# Patient Record
Sex: Female | Born: 1975 | Race: Black or African American | Hispanic: No | Marital: Married
Health system: Southern US, Community
[De-identification: ages and names within clinical notes are randomized; demographics above are authoritative.]

## PROBLEM LIST (undated history)

## (undated) DIAGNOSIS — I10 Essential (primary) hypertension: Secondary | ICD-10-CM

## (undated) HISTORY — PX: ABDOMINAL HYSTERECTOMY: SHX81

---

## 2019-11-04 ENCOUNTER — Encounter: Payer: Self-pay | Admitting: Emergency Medicine

## 2019-11-04 ENCOUNTER — Emergency Department: Payer: 59

## 2019-11-04 ENCOUNTER — Emergency Department
Admission: EM | Admit: 2019-11-04 | Discharge: 2019-11-04 | Disposition: A | Discharge status: Home / Self Care | Payer: 59 | Attending: Student | Admitting: Student

## 2019-11-04 ENCOUNTER — Other Ambulatory Visit: Payer: Self-pay

## 2019-11-04 DIAGNOSIS — R0789 Other chest pain: Secondary | ICD-10-CM | POA: Diagnosis not present

## 2019-11-04 DIAGNOSIS — R519 Headache, unspecified: Secondary | ICD-10-CM | POA: Insufficient documentation

## 2019-11-04 DIAGNOSIS — R079 Chest pain, unspecified: Secondary | ICD-10-CM | POA: Diagnosis present

## 2019-11-04 DIAGNOSIS — I10 Essential (primary) hypertension: Secondary | ICD-10-CM | POA: Insufficient documentation

## 2019-11-04 HISTORY — DX: Essential (primary) hypertension: I10

## 2019-11-04 LAB — CBC
HCT: 37.5 % (ref 36.0–46.0)
Hemoglobin: 12.8 g/dL (ref 12.0–15.0)
MCH: 27.7 pg (ref 26.0–34.0)
MCHC: 34.1 g/dL (ref 30.0–36.0)
MCV: 81.2 fL (ref 80.0–100.0)
Platelets: 296 10*3/uL (ref 150–400)
RBC: 4.62 MIL/uL (ref 3.87–5.11)
RDW: 14 % (ref 11.5–15.5)
WBC: 7 10*3/uL (ref 4.0–10.5)
nRBC: 0 % (ref 0.0–0.2)

## 2019-11-04 LAB — COMPREHENSIVE METABOLIC PANEL
ALT: 15 U/L (ref 0–44)
AST: 17 U/L (ref 15–41)
Albumin: 4 g/dL (ref 3.5–5.0)
Alkaline Phosphatase: 48 U/L (ref 38–126)
Anion gap: 8 (ref 5–15)
BUN: 10 mg/dL (ref 6–20)
CO2: 27 mmol/L (ref 22–32)
Calcium: 9.2 mg/dL (ref 8.9–10.3)
Chloride: 102 mmol/L (ref 98–111)
Creatinine, Ser: 0.86 mg/dL (ref 0.44–1.00)
GFR calc Af Amer: >60 mL/min (ref >60)
GFR calc non Af Amer: >60 mL/min (ref >60)
Glucose, Bld: 115 mg/dL — ABNORMAL HIGH (ref 70–99)
Potassium: 3.3 mmol/L — ABNORMAL LOW (ref 3.5–5.1)
Sodium: 137 mmol/L (ref 135–145)
Total Bilirubin: 0.7 mg/dL (ref 0.3–1.2)
Total Protein: 7.9 g/dL (ref 6.5–8.1)

## 2019-11-04 LAB — TROPONIN I (HIGH SENSITIVITY)
Troponin I (High Sensitivity): 4 ng/L (ref <18)
Troponin I (High Sensitivity): 5 ng/L (ref <18)

## 2019-11-04 IMAGING — CT CT HEAD W/O CM
3 series · 16 of 47 positions shown, 19 images · non-contrast
Comparison: None.

CLINICAL DATA: 43-year-old female with severe acute headache. Worst
headache of life.

EXAM:
CT HEAD WITHOUT CONTRAST
TECHNIQUE: Contiguous axial images were obtained from the base of the skull
through the vertex without intravenous contrast.

[Series 2: head wo · axial · 0.41mm/px · z∈[+487,+612]mm · 10 of 31 slices shown, 13 images]
[im 3/31  brain]
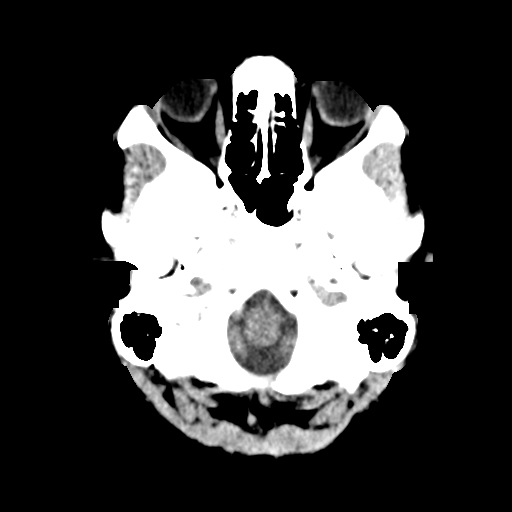
[im 3/31  bone]
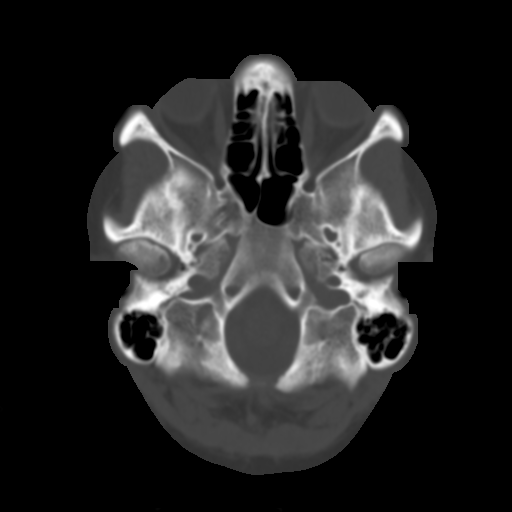
[im 6/31  brain]
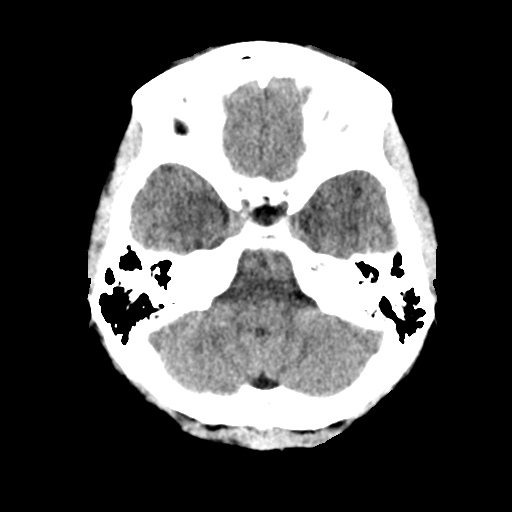
[im 9/31  brain]
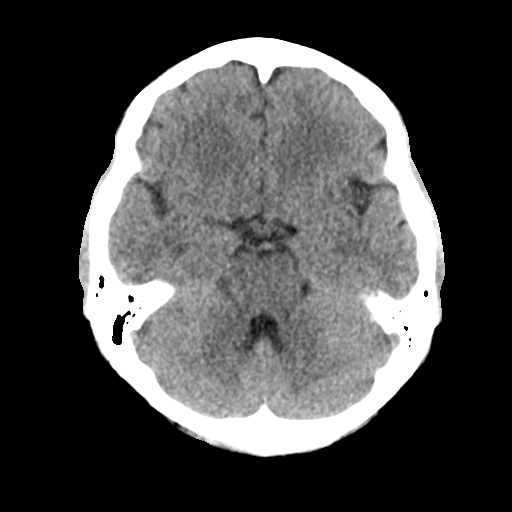
[im 11/31  brain]
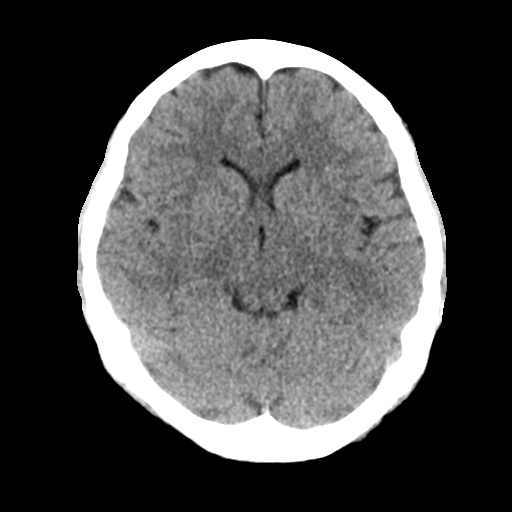
[im 14/31  brain]
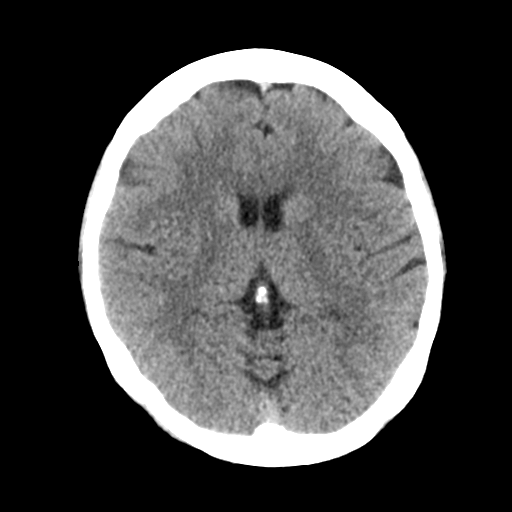
[im 14/31  bone]
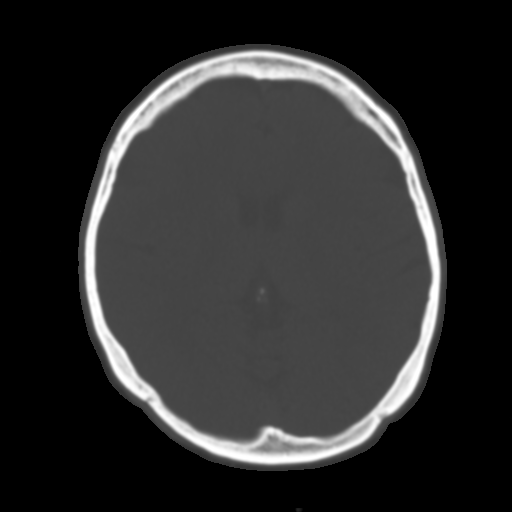
[im 17/31  brain]
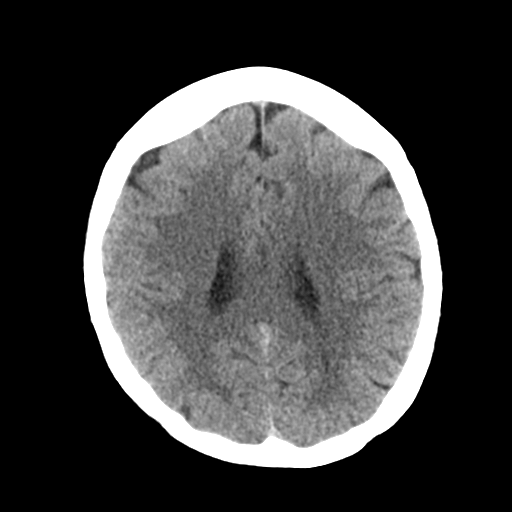
[im 20/31  brain]
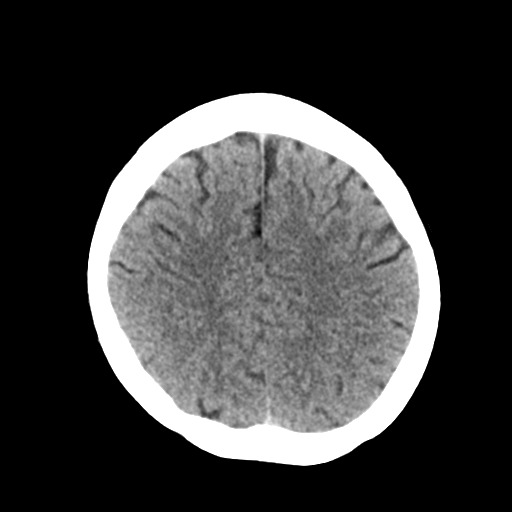
[im 23/31  brain]
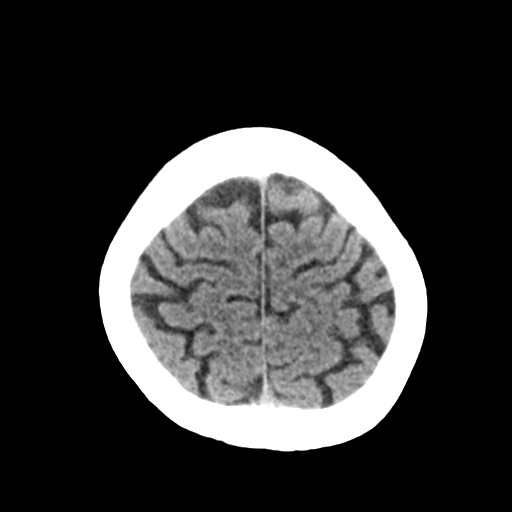
[im 25/31  brain]
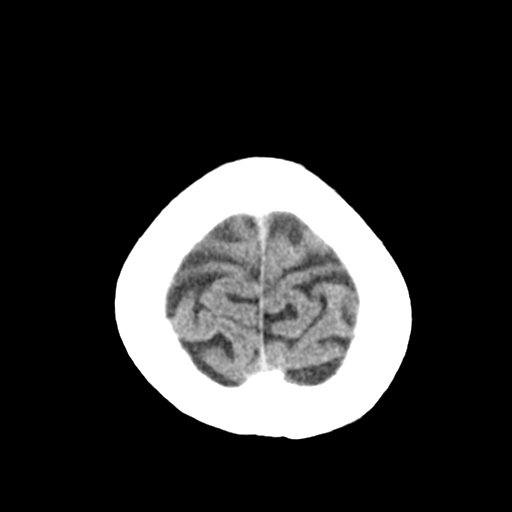
[im 25/31  bone]
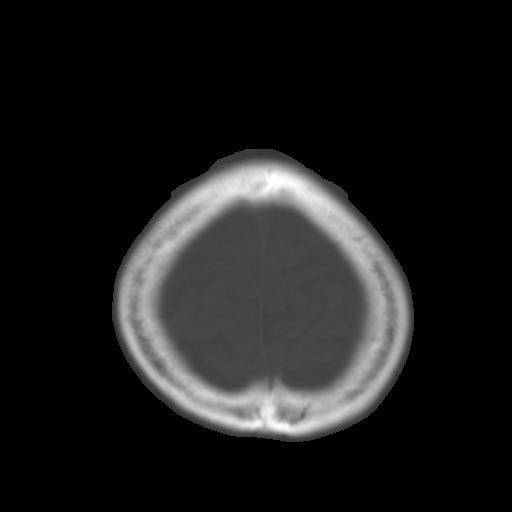
[im 28/31  brain]
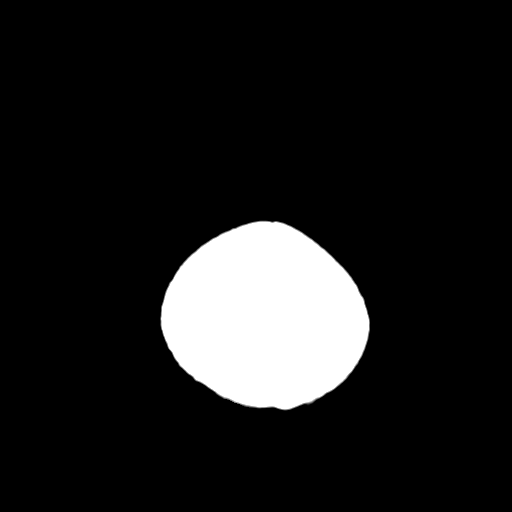

[Series 4: coronal soft tissue · coronal · 0.31mm/px · 3 of 61 slices shown]
[im 21/61  brain]
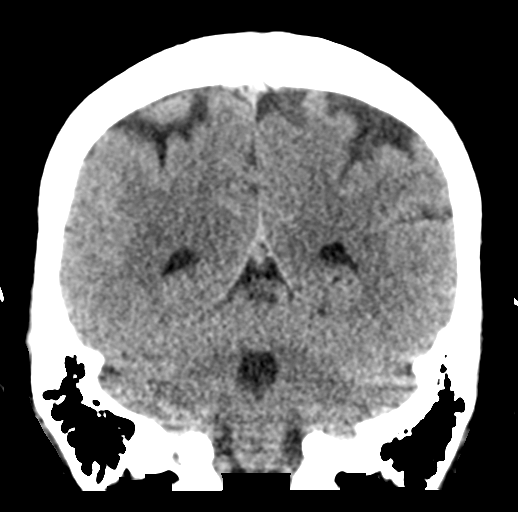
[im 27/61  brain]
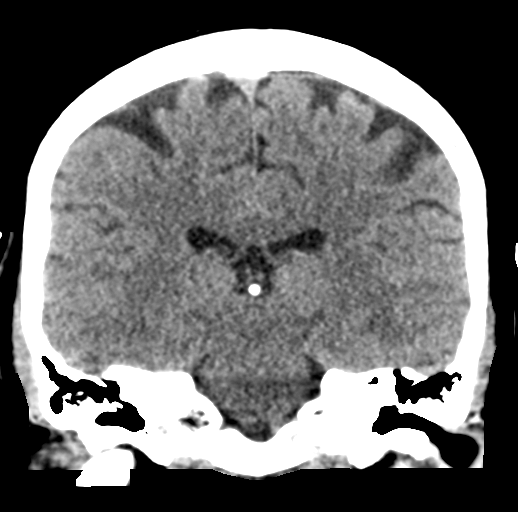
[im 34/61  brain]
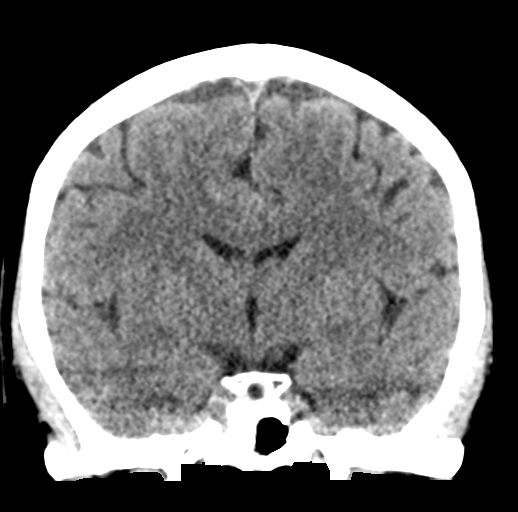

[Series 5: sagittal soft tissue · sagittal · 0.32mm/px · 3 of 55 slices shown]
[im 19/55  brain]
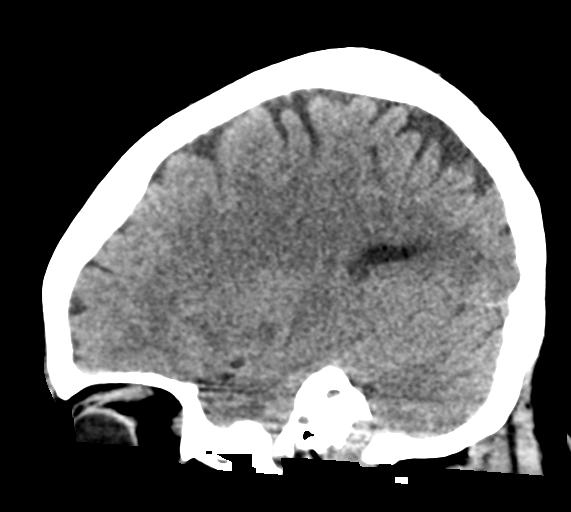
[im 28/55  brain]
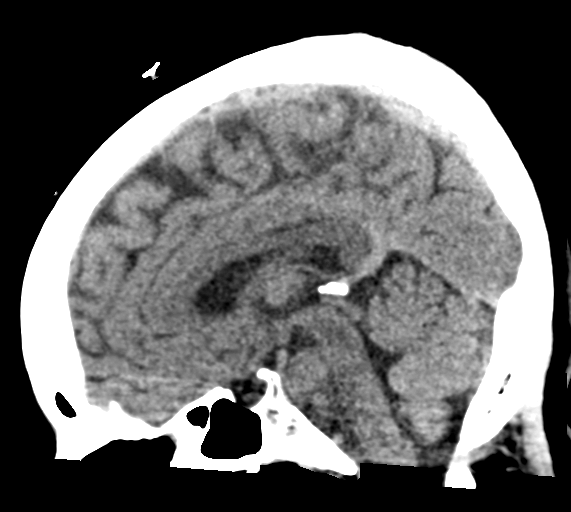
[im 37/55  brain]
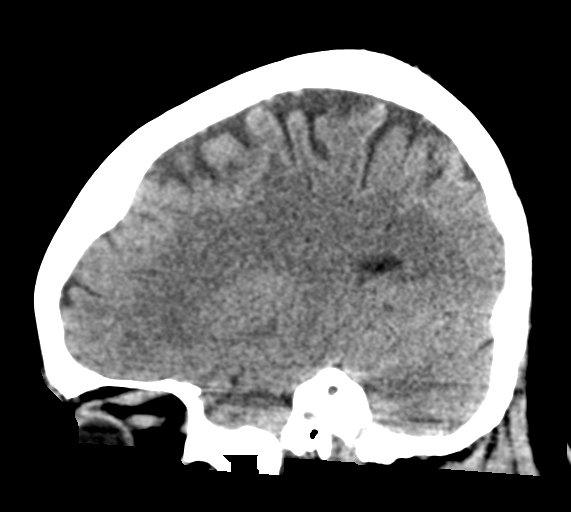

[16 of 47 positions shown; findings below may reference images not displayed]

FINDINGS: Brain: Normal cerebral volume. No midline shift, ventriculomegaly,
mass effect, evidence of mass lesion, intracranial hemorrhage or
evidence of cortically based acute infarction. Gray-white matter
differentiation is within normal limits throughout the brain.

Vascular: No suspicious intracranial vascular hyperdensity.

Skull: Negative.

Sinuses/Orbits: Visualized paranasal sinuses and mastoids are clear.

Other: Visualized orbits and scalp soft tissues are within normal
limits.
IMPRESSION: Normal noncontrast CT appearance of the brain.

## 2019-11-04 IMAGING — DX DG CHEST 1V PORT
1 series · 1 of 1 positions shown · non-contrast
Comparison: None.

CLINICAL DATA: Chest pain, headache.

EXAM:
PORTABLE CHEST 1 VIEW

[chest ap]
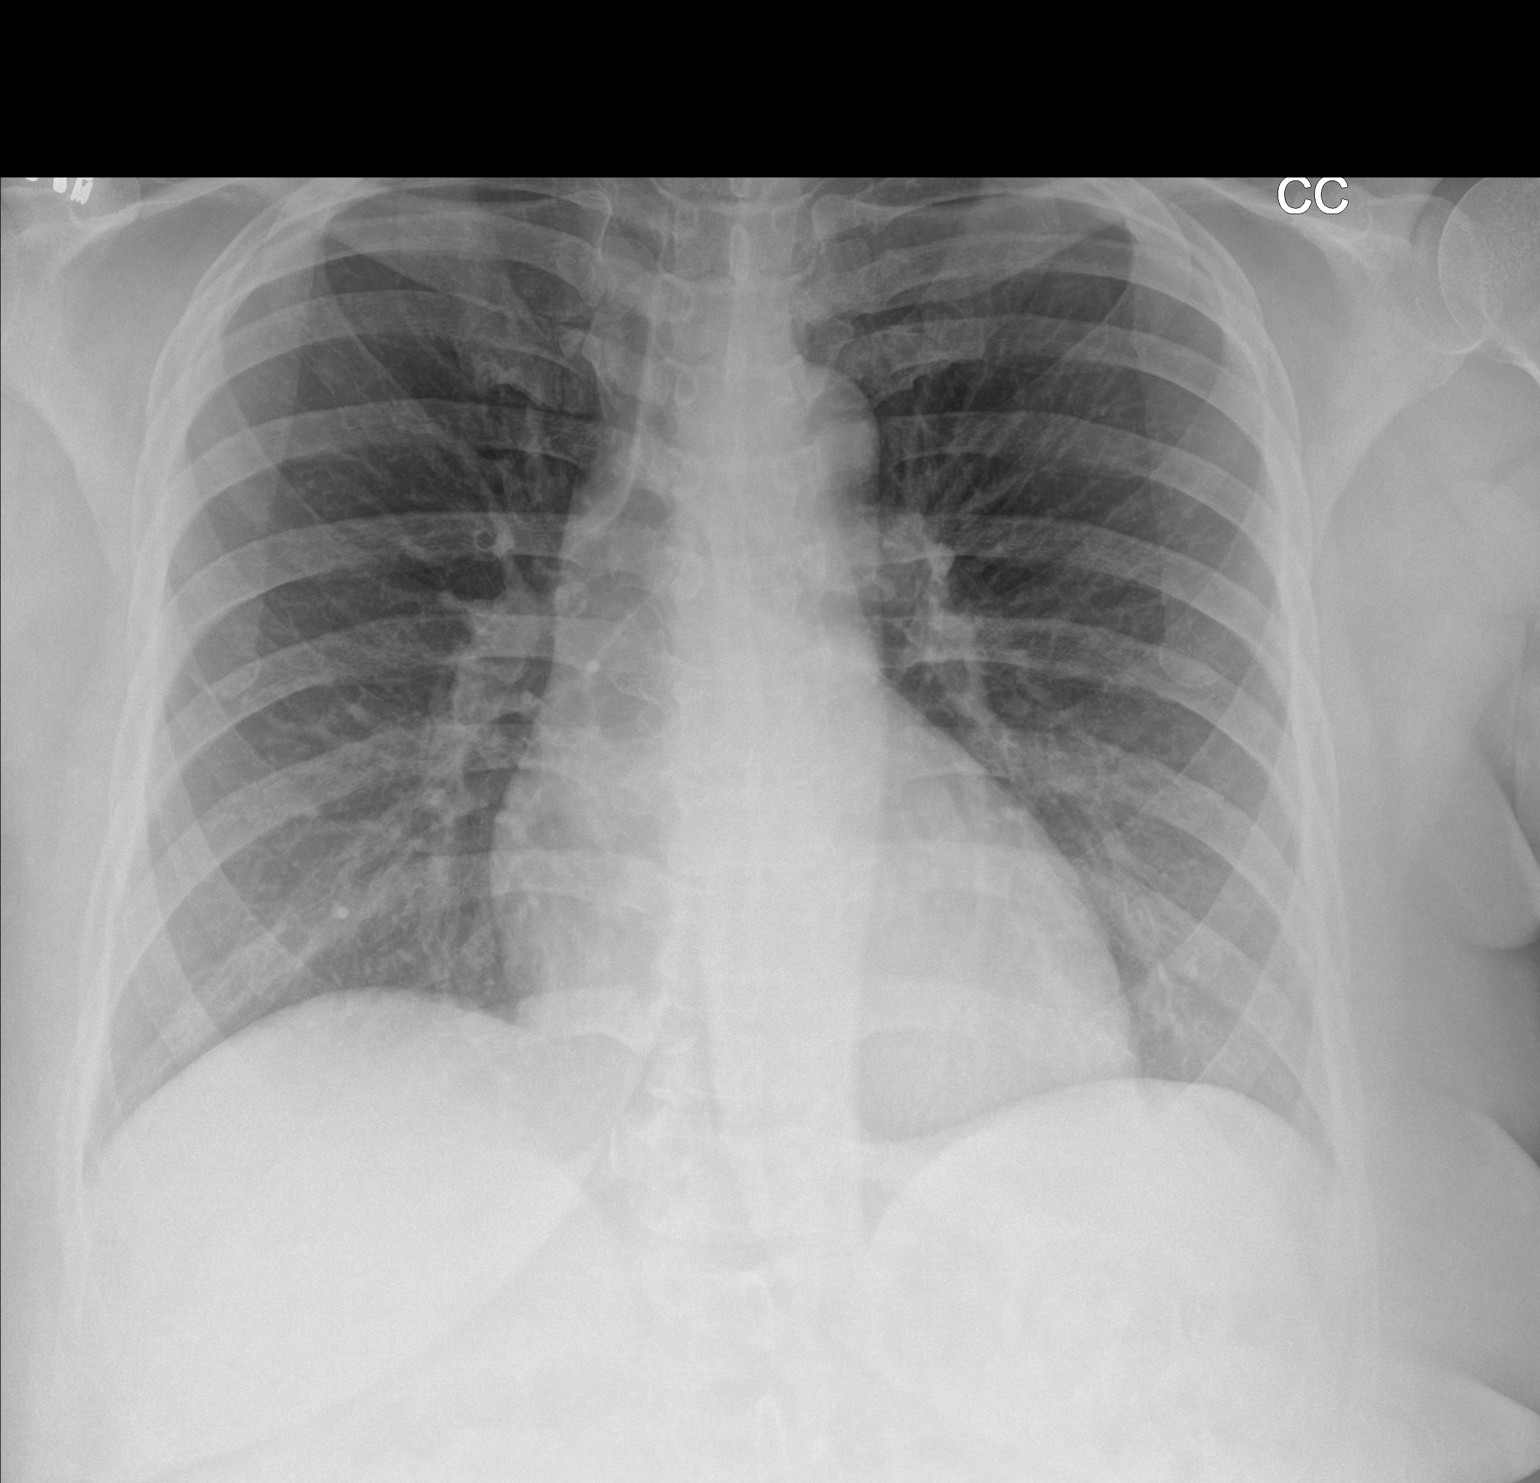

[1 of 1 positions shown; findings below may reference images not displayed]

FINDINGS: The heart size and mediastinal contours are within normal limits.
Both lungs are clear. The visualized skeletal structures are
unremarkable.
IMPRESSION: No acute cardiopulmonary disease.

## 2019-11-04 MED ORDER — ALUM & MAG HYDROXIDE-SIMETH 200-200-20 MG/5ML PO SUSP
30.0000 mL | Freq: Once | ORAL | Status: AC
  Administered 2019-11-04: 30 mL via ORAL
  Filled 2019-11-04: qty 30

## 2019-11-04 MED ORDER — SODIUM CHLORIDE 0.9 % IV BOLUS: 1000.0000 mL | Freq: Once | INTRAVENOUS | Status: DC

## 2019-11-04 MED ORDER — SODIUM CHLORIDE 0.9 % IV BOLUS
500.0000 mL | Freq: Once | INTRAVENOUS | Status: AC
  Administered 2019-11-04: 500 mL via INTRAVENOUS

## 2019-11-04 MED ORDER — METOCLOPRAMIDE HCL 5 MG/ML IJ SOLN
10.0000 mg | Freq: Once | INTRAMUSCULAR | Status: AC
  Administered 2019-11-04: 10 mg via INTRAVENOUS
  Filled 2019-11-04: qty 2

## 2019-11-04 MED ORDER — ACETAMINOPHEN 500 MG PO TABS
1000.0000 mg | ORAL_TABLET | Freq: Once | ORAL | Status: AC
  Administered 2019-11-04: 1000 mg via ORAL
  Filled 2019-11-04: qty 2

## 2019-11-04 MED ORDER — DIPHENHYDRAMINE HCL 50 MG/ML IJ SOLN
12.5000 mg | Freq: Once | INTRAMUSCULAR | Status: AC
  Administered 2019-11-04: 15:00:00 12.5 mg via INTRAVENOUS
  Filled 2019-11-04: qty 1

## 2019-11-04 NOTE — ED Provider Notes (Signed)
[facility] Emergency Department Provider Note  ____________________________________________   None    (approximate)   I have reviewed the triage vital signs and the nursing notes.   Patient has been triaged with a MSE exam performed by myself at a minimum. Based on symptoms and screening exam, patient may receive a more in-depth exam, labs, imaging as detailed below. Patients have been advised of this setting and exam type at the time of patient interview.    HISTORY  Chief Complaint Chest Pain and Headache    HPI Dana Franklin is a 43 y.o. female presents to the emergency department with a complaint of with complaints of chest pain and headache.  HA is worst she has ever had.  Feels like pressure.  Cp radiates to left arm.  No sob or diaphoresis.  Is seeing floaters.  Did have a episode of seeing a dark area like a shade in left eye but it went away.  No slurred speech    Patient will receive a medical screening exam as detailed below.  Based off of this exam, more in depth exam, labs, imaging will be performed as needed for complaint.  Patient care will be eventually transferred to another provider in the emergency department for final exam, diagnosis and disposition.    No past medical history on file.  There are no active problems to display for this patient.    Prior to Admission medications   Not on File    Allergies Patient has no allergy information on record.  No family history on file.  Social History Social History   Tobacco Use  . Smoking status: Not on file  Substance Use Topics  . Alcohol use: Not on file  . Drug use: Not on file    Review of Systems Constitutional: denies fever ENT: denies nasal congestion/rhinorhea. no sore throat Cardiovascular: c/o chest pain.with radiation to left arm  Respiratory: denies cough. denies shortness of breath/difficulty breathing Gastroenterology: denies abdominal pain  Musculoskeletal: deneis for musculoskeletal pain Integumentary: Negative for rash. Neurological: No focal weakness nor numbness. Headache, numbness in left arm, visual change that has resolved   ____________________________________________   PHYSICAL EXAM:  VITAL SIGNS: ED Triage Vitals  Enc Vitals Group     BP      Pulse      Resp      Temp      Temp src      SpO2      Weight      Height      Head Circumference      Peak Flow      Pain Score      Pain Loc      Pain Edu?      Excl. in [place]?     Constitutional: Alert and oriented. Generally well appearing and in no acute distress. Eyes: Conjunctivae are normal.  Nose: No significant congestion/rhinnorhea. Mouth: No gross oropharyngeal edema. no erythema/edema Neck: No stridor.  No meningeal signs.   Cardiovascular: Grossly normal heart sounds. Respiratory: Normal respiratory effort without significant tachypnea and no observed retractions. Lungs c t a Musculoskeletal: No gross deformities of extremities. Neurologic:  Normal speech and language. No gross focal neurologic deficits are appreciated. Grips = b/l,  Skin:  Skin is warm, dry and intact. No rash noted.    ____________________________________________   LABS (all labs ordered are listed, but only abnormal results are displayed)  Labs Reviewed - No data to display  ____________________________________________  RADIOLOGY Ct head cxr  Official radiology report(s): No results found.  ____________________________________________    INITIAL IMPRESSION / MDM / ASSESSMENT AND PLAN / ED COURSE  As part of my medical decision making, I reviewed the following data within the electronic MEDICAL RECORD NUMBER Nursing notes reviewed and incorporated, EKG interpreted NSR, Radiograph reviewed cxr and head ct, Notes from prior ED visits and [place] Controlled Substance Database      Clinical Impression: headache, chest pain   Plan: labs, cxr, ct head  Patient has  been screened based based on their arrival complaint, evaluated for an emergent condition, and at a minimum has received a medical screening exam.  At this time, patient will receive further work-up as determined by medical screening exam.  Patient care will eventually be transferred to another provider in the emergency department for final diagnosis and disposition.    ____________________________________________  Note:  This document was prepared using Conservation officer, historic buildings and may include unintentional dictation errors.    Faythe Ghee, PA-C 11/04/19 1249    Emily Filbert, MD 11/04/19 [phone]

## 2019-11-04 NOTE — ED Triage Notes (Signed)
Chest pain since 8 am, took 4 baby ASA, began headache 1 hour ago. States L hand numbness began during this time.

## 2019-11-04 NOTE — Discharge Instructions (Addendum)
Thank you for letting us take care of you in the emergency department today.  ° °Please continue to take any regular, prescribed medications.  ° °Please follow up with: °- Your primary care doctor to review your ER visit and follow up on your symptoms.  ° °Please return to the ER for any new or worsening symptoms.  ° °

## 2019-11-04 NOTE — ED Provider Notes (Signed)
[facility] Emergency Department Provider Note  ____________________________________________   First MD Initiated Contact with Patient 11/04/19 1437     (approximate)  I have reviewed the triage vital signs and the nursing notes.  History  Chief Complaint Chest Pain and Headache    HPI Dana Franklin is a 43 y.o. female with hx of GERD, HTN who presents for chest pressure and headache.   She reports developing chest pressure this AM, which she felt was related to her GERD. She took her prescribed PPI and Tums, without significant relief. No associated SOB, or diaphoresis. Chest pressure located in the center. This does not radiate. Moderate in severity. Took 4 ASA prior to arrival. Hx of HTN. No hx of HLD, DM. Does not smoke.   A few hours later she developed a headache to the back of the head (she points to the parietoccipital area). She describes this as a pressure sensation. currently moderate in severity. Does not radiate. Accompanied by some left sided peripheral flashing lights right at the onset of the headache. No new weakness, numbness, tingling. No blurred vision.   She primarily present due to the headache.    Past Medical Hx Past Medical History:  Diagnosis Date  . Hypertension     Problem List There are no active problems to display for this patient.   Past Surgical Hx Past Surgical History:  Procedure Laterality Date  . ABDOMINAL HYSTERECTOMY      Medications Prior to Admission medications   Not on File    Allergies Hydrochlorothiazide  Family Hx No family history on file.  Social Hx Social History   Tobacco Use  . Smoking status: Not on file  Substance Use Topics  . Alcohol use: Not on file  . Drug use: Not on file     Review of Systems  Constitutional: Negative for fever, chills. Eyes: Negative for visual changes. ENT: Negative for sore throat. Cardiovascular:  + for chest pain. Respiratory: Negative for  shortness of breath. Gastrointestinal: Negative for nausea, vomiting.  Genitourinary: Negative for dysuria. Musculoskeletal: Negative for leg swelling. Skin: Negative for rash. Neurological: + for for headaches.   Physical Exam  Vital Signs: ED Triage Vitals  Enc Vitals Group     BP 11/04/19 1236 (!) 163/107     Pulse Rate 11/04/19 1236 72     Resp 11/04/19 1236 20     Temp 11/04/19 1236 (!) 97.5 F (36.4 C)     Temp Source 11/04/19 1236 Oral     SpO2 11/04/19 1236 100 %     Weight 11/04/19 1238 200 lb (90.7 kg)     Height 11/04/19 1238 5\' 7"  (1.702 m)     Head Circumference --      Peak Flow --      Pain Score 11/04/19 1238 10     Pain Loc --      Pain Edu? --      Excl. in [place]? --     Constitutional: Alert and oriented.  Head: Normocephalic. Atraumatic. Eyes: Conjunctivae clear. Sclera anicteric. EOMI. Visual fields in tact. Nose: No congestion. No rhinorrhea. Mouth/Throat: Mucous membranes are moist.  Neck: No stridor.   Cardiovascular: Normal rate, regular rhythm. Extremities well perfused. Respiratory: Normal respiratory effort.  Lungs CTAB. Gastrointestinal: Soft. Non-tender. Non-distended.  Musculoskeletal: No lower extremity edema. No deformities. Neurologic:  Normal speech and language. No gross focal neurologic deficits are appreciated. Alert and oriented.  Face symmetric.  Tongue midline.  Cranial nerves  II through XII intact. UE and LE strength 5/5 and symmetric. UE and LE SILT.  Skin: Skin is warm, dry and intact. No rash noted. Psychiatric: Mood and affect are appropriate for situation.  EKG  Personally reviewed.   Rate: 71 Rhythm: sinus Axis: borderline leftward Intervals: WNL Non-specific T wave flattening No acute ischemic changes No STEMI    Radiology  CT:  IMPRESSION:  Normal noncontrast CT appearance of the brain.   XR: IMPRESSION: No acute cardiopulmonary disease.   Procedures  Procedure(s) performed (including critical care):   Procedures   Initial Impression / Assessment and Plan / ED Course  43 y.o. female who presents to the ED for headache and chest pressure, as above.   Chest pressure, Ddx: ACS, GERD  Headache, DDx: suspect migraine w/ preceding aura, also consider complex migraine, tension headache.  Do not suspect infectious etiology given no neck stiffness, no fever.  Headache is not thunderclap in description or worst of life, low suspicion for [facility].  EKG without acute ischemic changes.  Troponin x 2 are negative.  Labs without actionable derangements.  CT head and CXR negative.  Patient reports resolution in her symptoms (both her chest discomfort and her headache) after GI cocktail and migraine cocktail, respectively.  As such, given her negative work-up she is stable for discharge with outpatient follow-up.  Patient is comfortable with the plan.  Given return precautions.   Final Clinical Impression(s) / ED Diagnosis  Final diagnoses:  Complaint of headache  Chest discomfort       Note:  This document was prepared using Dragon voice recognition software and may include unintentional dictation errors.   Miguel Aschoff., MD 11/04/19 [phone]

## 2022-05-18 ENCOUNTER — Other Ambulatory Visit: Payer: Self-pay | Admitting: Physical Medicine & Rehabilitation

## 2022-05-18 DIAGNOSIS — G8929 Other chronic pain: Secondary | ICD-10-CM

## 2022-05-27 ENCOUNTER — Ambulatory Visit
Admission: RE | Admit: 2022-05-27 | Discharge: 2022-05-27 | Disposition: A | Discharge status: Home / Self Care | Payer: 59 | Source: Ambulatory Visit | Attending: Physical Medicine & Rehabilitation | Admitting: Physical Medicine & Rehabilitation

## 2022-05-27 DIAGNOSIS — G8929 Other chronic pain: Secondary | ICD-10-CM

## 2022-05-27 DIAGNOSIS — M546 Pain in thoracic spine: Secondary | ICD-10-CM | POA: Insufficient documentation

## 2022-05-27 DIAGNOSIS — M5442 Lumbago with sciatica, left side: Secondary | ICD-10-CM | POA: Insufficient documentation

## 2022-05-27 IMAGING — MR MR THORACIC SPINE W/O CM
5 of 6 series · 28 of 48 positions shown · non-contrast
Comparison: None available.

CLINICAL DATA: Initial evaluation for upper back pain with
radiation into the left upper extremity with numbness and tingling
for 6 months.

EXAM:
MRI THORACIC SPINE WITHOUT CONTRAST
TECHNIQUE: Multiplanar, multisequence MR imaging of the thoracic spine was
performed. No intravenous contrast was administered.

[Series 18: T1 · sagittal · 6.0mm · 1.88mm/px · 2 of 9 slices shown (1 of 2)]
[im 1/9]
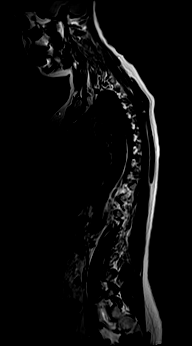
[im 9/9]
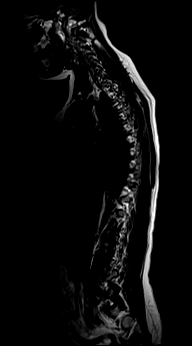

[Series 19: T2 · sagittal · 3.0mm · 1.06mm/px · 6 of 17 slices shown (1 of 2)]
[im 1/17]
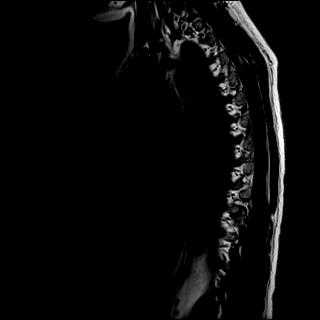
[im 4/17]
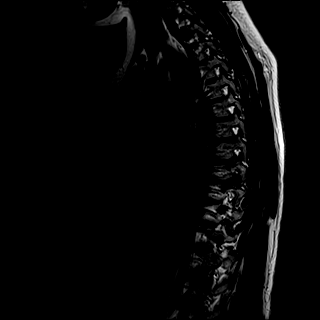
[im 7/17]
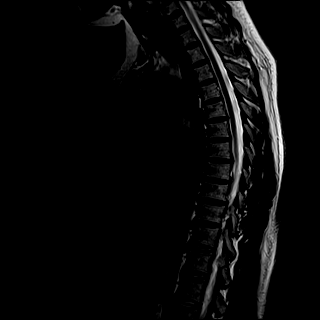
[im 10/17]
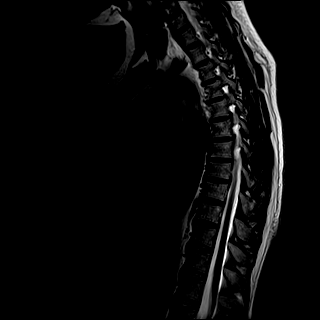
[im 13/17]
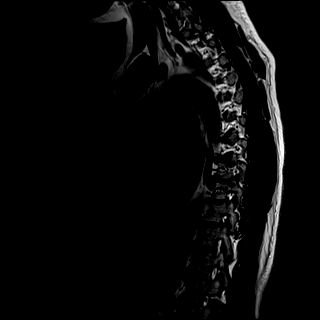
[im 17/17]
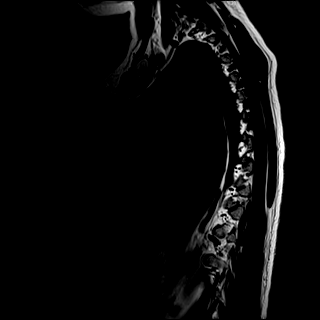

[Series 20: T1 · sagittal · 3.0mm · 1.06mm/px · 6 of 17 slices shown (2 of 2)]
[im 1/17]
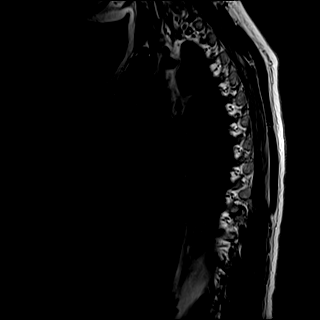
[im 4/17]
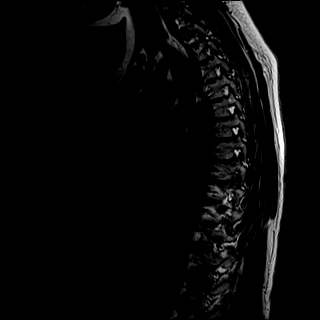
[im 7/17]
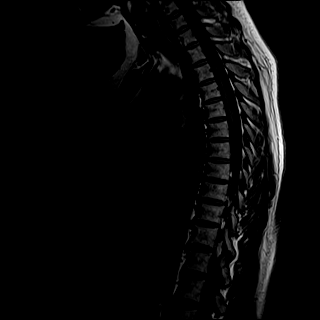
[im 10/17]
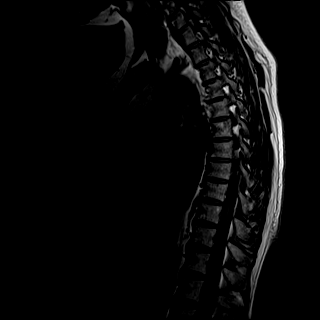
[im 13/17]
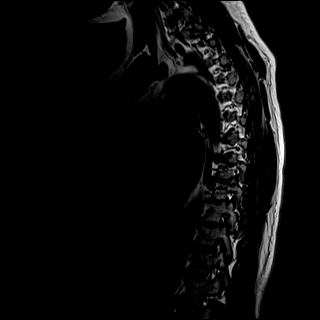
[im 17/17]
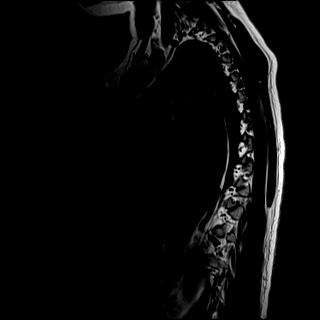

[Series 21: STIR · sagittal · 3.0mm · 0.53mm/px · 6 of 17 slices shown]
[im 1/17]
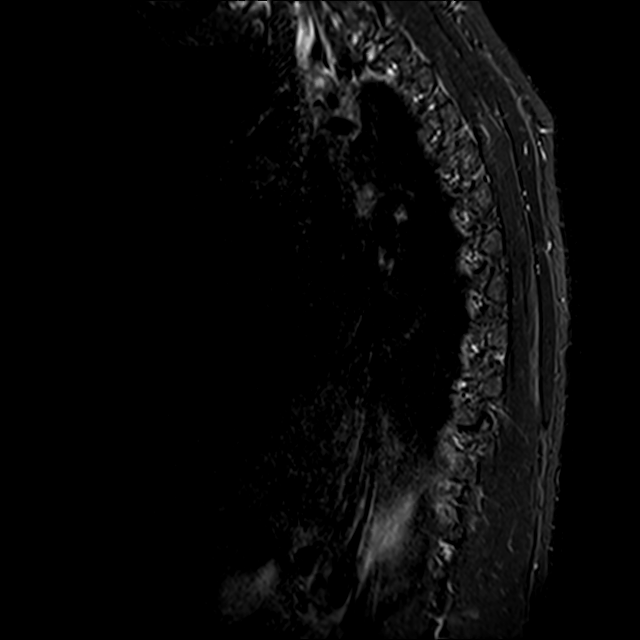
[im 4/17]
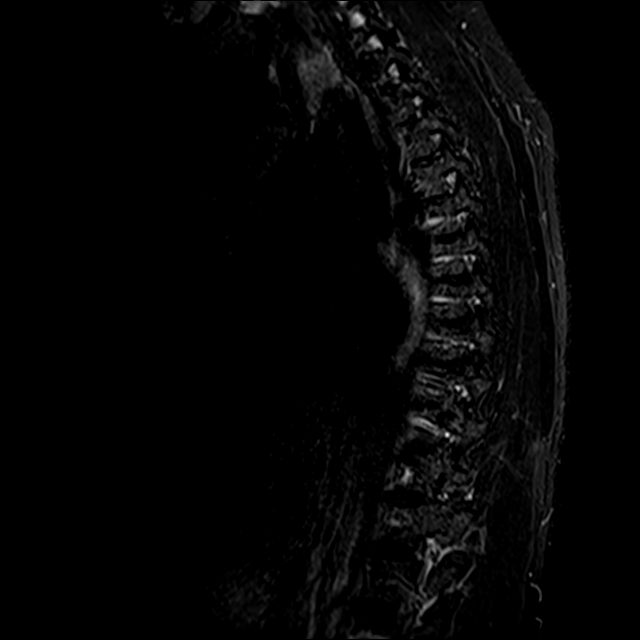
[im 7/17]
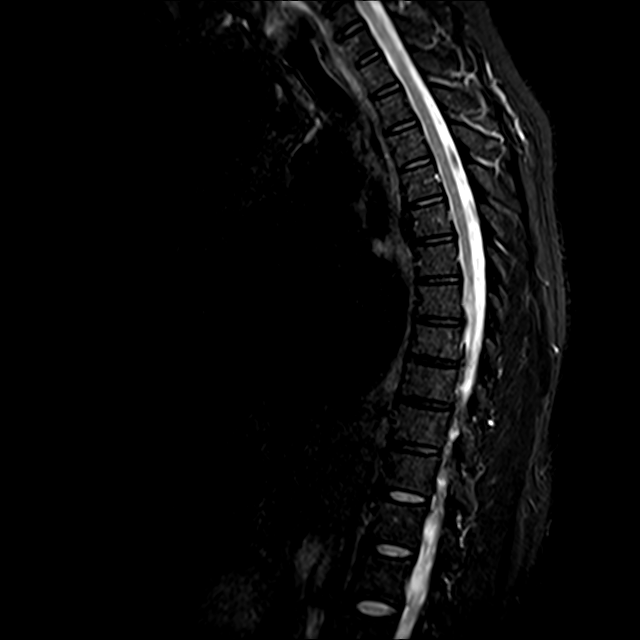
[im 10/17]
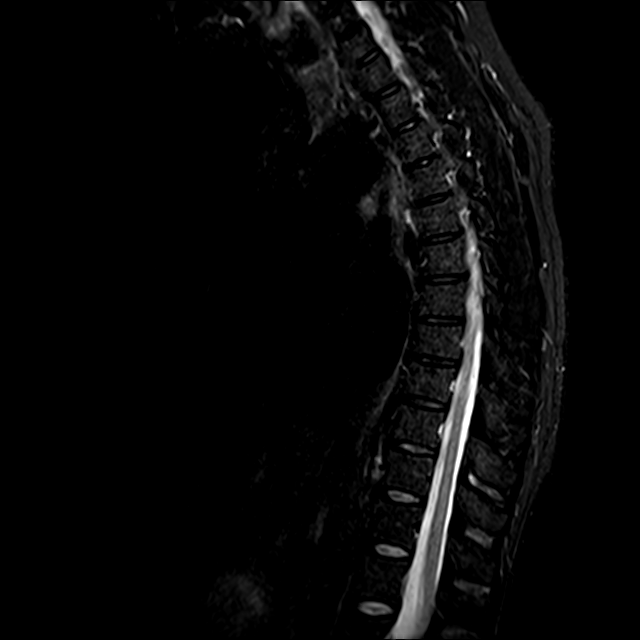
[im 13/17]
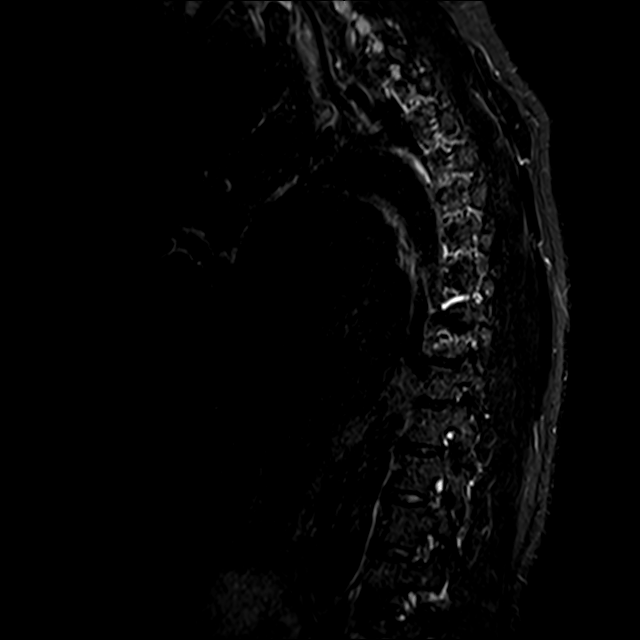
[im 17/17]
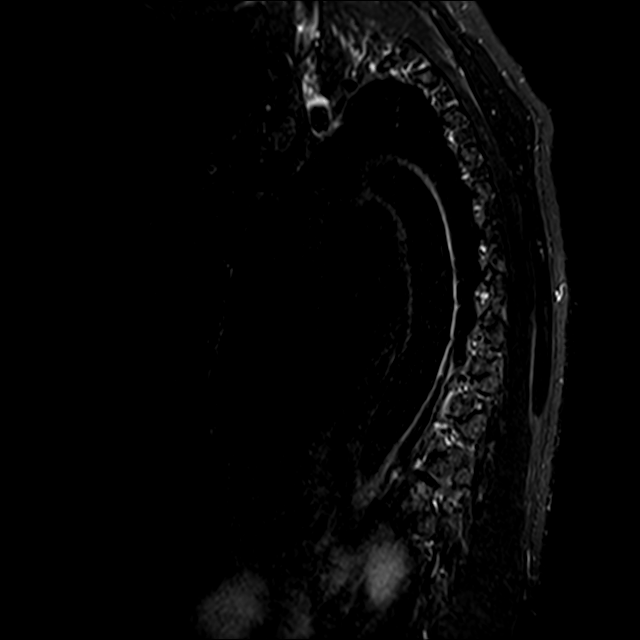

[Series 22: T2 · axial · 4.0mm · 0.59mm/px · z∈[-210,+5]mm · 8 of 39 slices shown (2 of 2)]
[im 1/39]
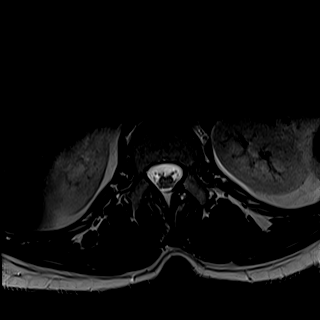
[im 6/39]
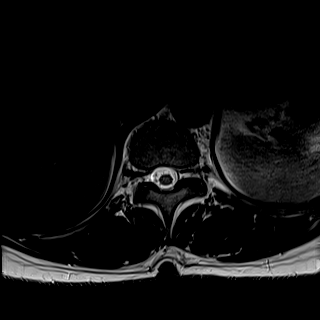
[im 12/39]
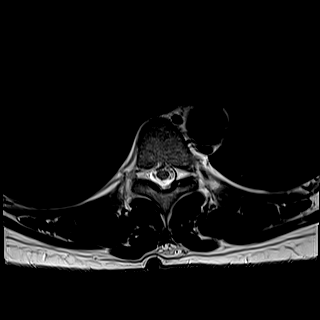
[im 18/39]
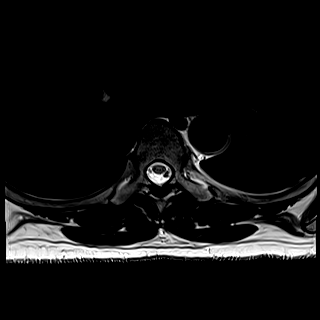
[im 21/39]
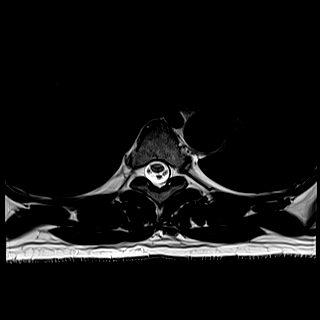
[im 27/39]
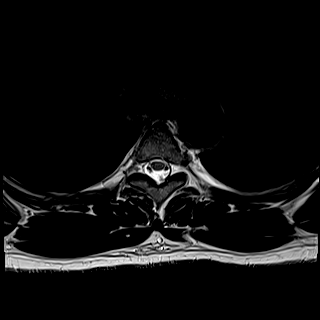
[im 33/39]
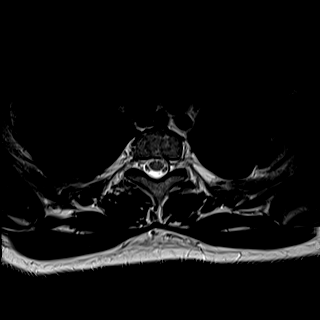
[im 39/39]
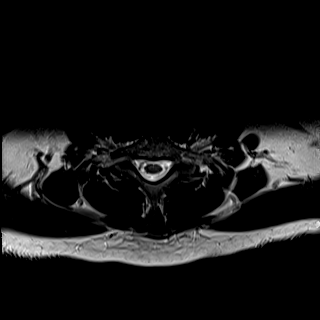

[28 of 48 positions shown; findings below may reference images not displayed]

FINDINGS: Alignment: Mild dextroscoliosis. Alignment otherwise normal with
preservation of the normal thoracic kyphosis. No listhesis.

Vertebrae: Vertebral body height maintained without acute or chronic
fracture. Bone marrow signal intensity within normal limits. No
discrete or worrisome osseous lesions or abnormal marrow edema.

Cord:  Normal signal and morphology.

Paraspinal and other soft tissues: Unremarkable.

Disc levels:

T1-2:  Unremarkable.

T2-3: Unremarkable.

T3-4:  Unremarkable.

T4-5:  Unremarkable.

T5-6:  Unremarkable.

T6-7:  Unremarkable.

T7-8: Right paracentral disc protrusion with annular fissure indents
the right ventral thecal sac, contacting and mildly flattening the
right ventral cord. No cord signal changes or significant spinal
stenosis. Foramina remain patent.

T8-9: Right paracentral disc extrusion with superior migration
indents the right ventral thecal sac, contacting and mildly
flattening the ventral cord. No cord signal changes or significant
spinal stenosis. Foramina remain patent.

T9-10: Mild disc bulge with small right paracentral disc protrusion
(series 22, image 29). No significant spinal stenosis or cord
deformity. Foramina remain patent.

T10-11: Negative interspace. Mild facet hypertrophy. No spinal
stenosis. Mild bilateral foraminal narrowing.

T11-12: Negative interspace. Mild facet hypertrophy. No spinal
stenosis. Foramina remain patent.

T12-L1:  Unremarkable.
IMPRESSION: 1. Right paracentral disc protrusions at T7-8 and T8-9 with
secondary mild flattening of the right hemi cord, but no cord signal
changes or significant spinal stenosis.
2. Mild disc bulge with smaller right paracentral disc protrusion at
T9-10 without stenosis or impingement.
3. Mild facet hypertrophy at T10-11 and T11-12. Resultant mild
bilateral foraminal stenosis at T10-11.
4. Otherwise negative thoracic spine. No other findings to explain
the patient's left upper extremity symptoms.

## 2022-05-27 IMAGING — MR MR LUMBAR SPINE W/O CM
5 series · 31 of 48 positions shown · non-contrast
Comparison: None Available.

CLINICAL DATA: Initial evaluation for chronic lower back pain with
left lower extremity pain.

EXAM:
MRI LUMBAR SPINE WITHOUT CONTRAST
TECHNIQUE: Multiplanar, multisequence MR imaging of the lumbar spine was
performed. No intravenous contrast was administered.

[Series 28: T2 · sagittal · 4.0mm · 0.81mm/px · 6 of 15 slices shown (1 of 2)]
[im 1/15]
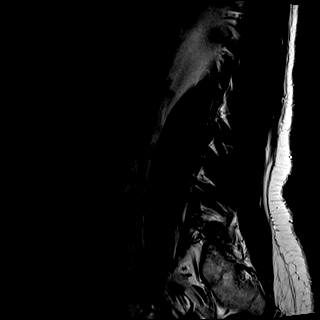
[im 3/15]
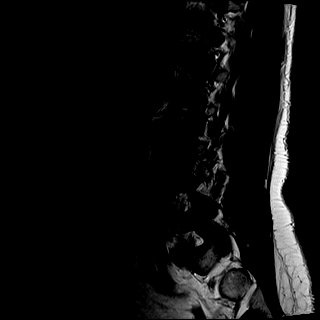
[im 6/15]
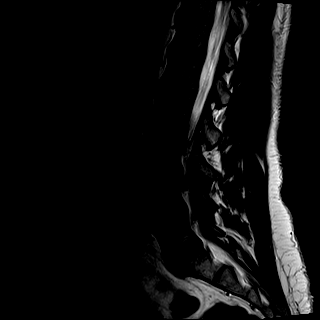
[im 9/15]
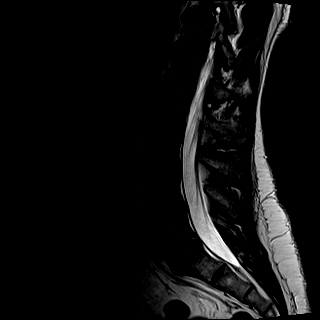
[im 12/15]
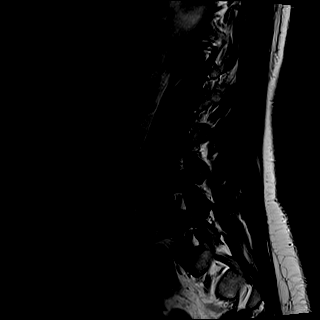
[im 15/15]
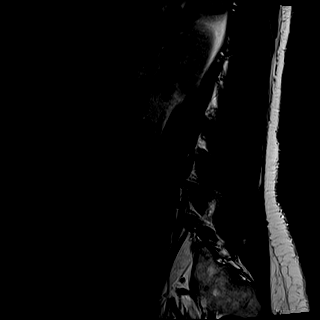

[Series 29: T1 · sagittal · 4.0mm · 0.81mm/px · 6 of 15 slices shown (1 of 2)]
[im 1/15]
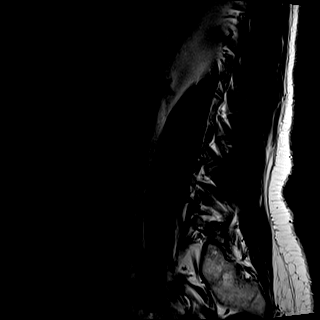
[im 3/15]
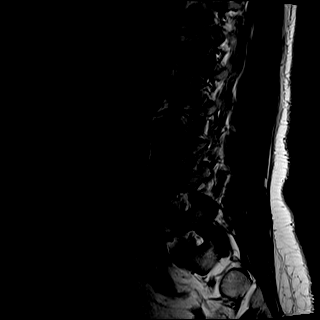
[im 6/15]
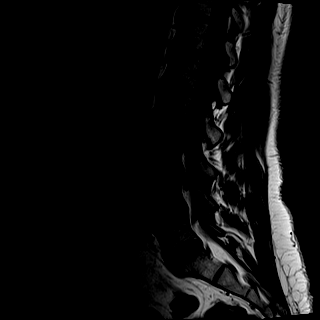
[im 9/15]
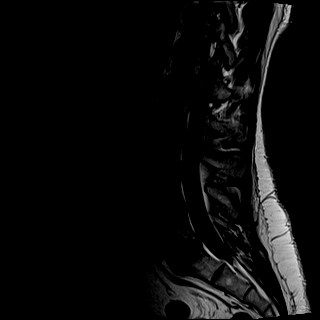
[im 12/15]
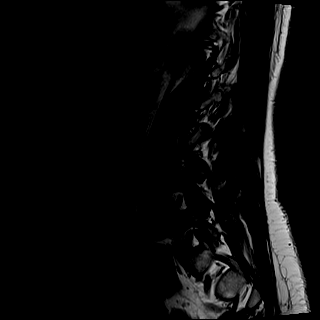
[im 15/15]
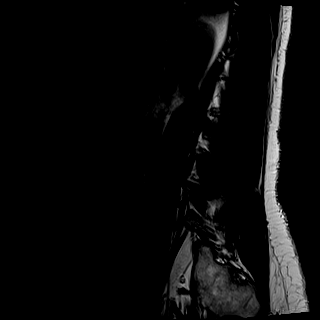

[Series 30: STIR · sagittal · 4.0mm · 0.41mm/px · 1 of 15 slices shown]
[im 1/15]
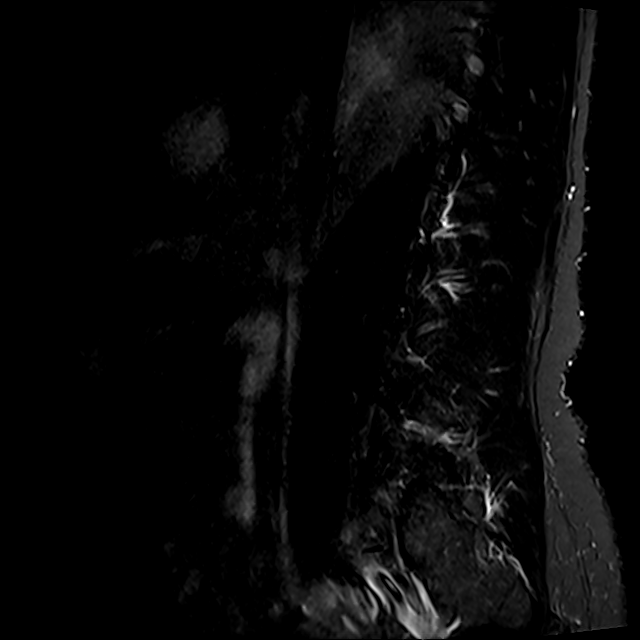

[Series 31: T2 · axial · 4.0mm · 0.78mm/px · z∈[-451,-227]mm · 9 of 35 slices shown (2 of 2)]
[im 1/35]
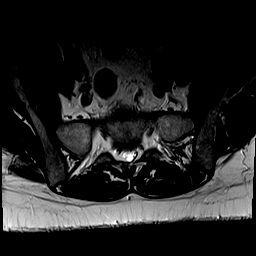
[im 5/35]
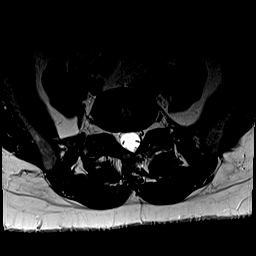
[im 10/35]
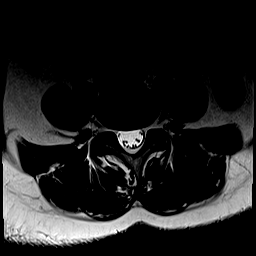
[im 15/35]
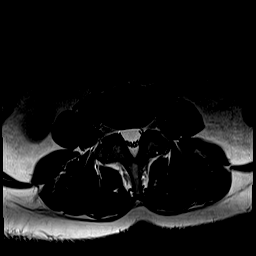
[im 18/35]
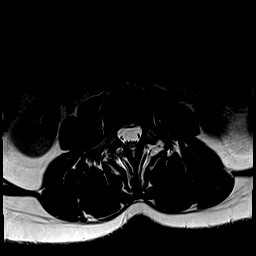
[im 20/35]
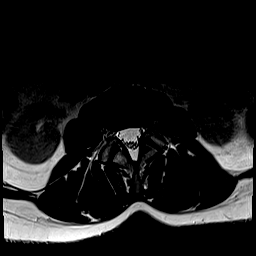
[im 25/35]
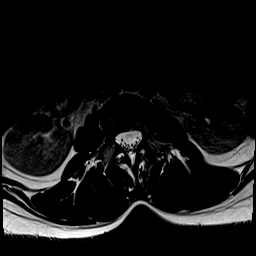
[im 30/35]
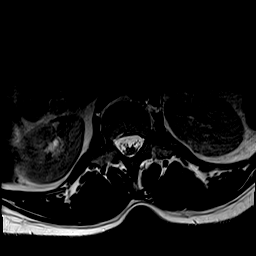
[im 35/35]
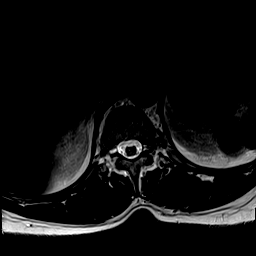

[Series 32: T1 · axial · 4.0mm · 0.39mm/px · z∈[-451,-227]mm · 9 of 35 slices shown (2 of 2)]
[im 1/35]
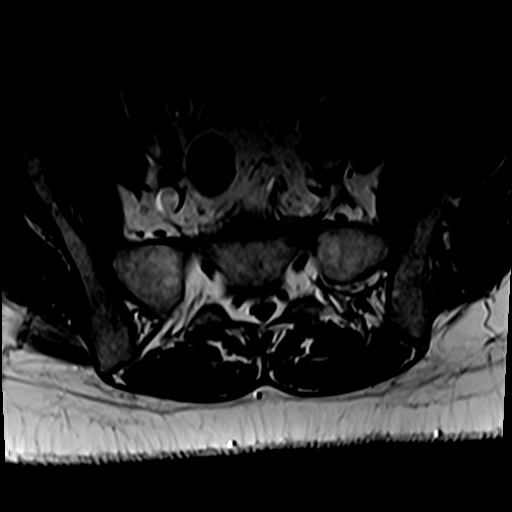
[im 5/35]
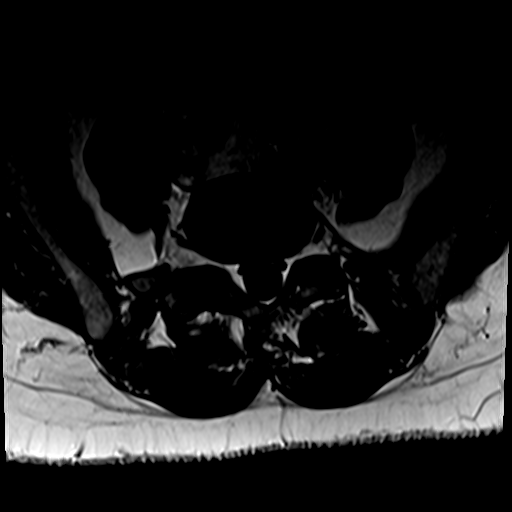
[im 10/35]
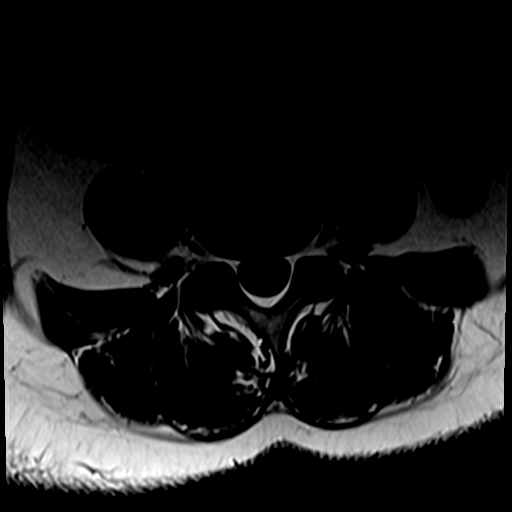
[im 15/35]
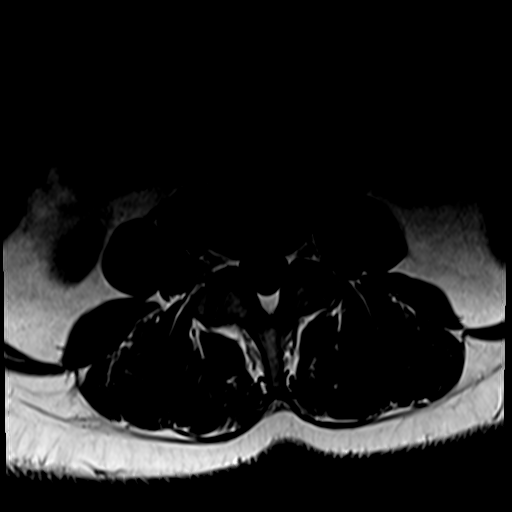
[im 18/35]
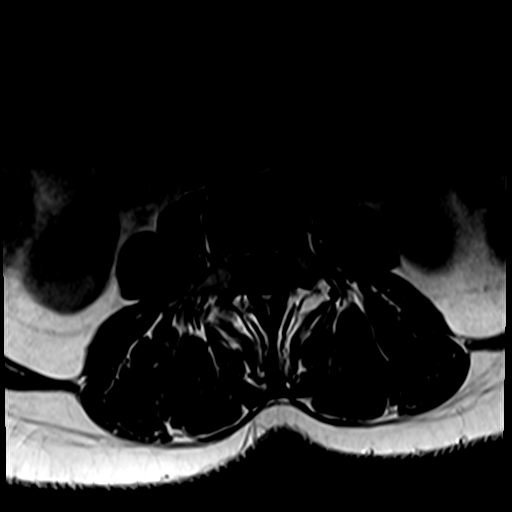
[im 20/35]
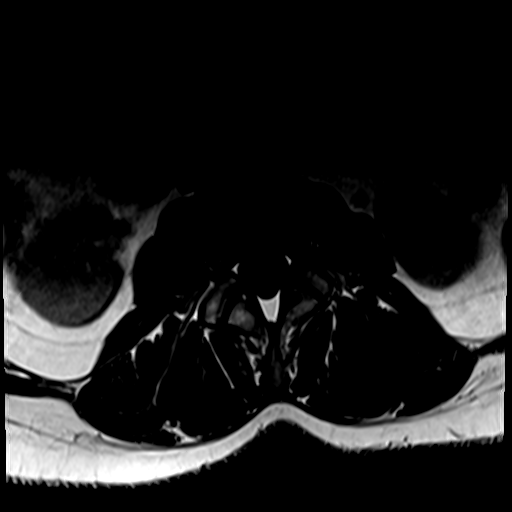
[im 25/35]
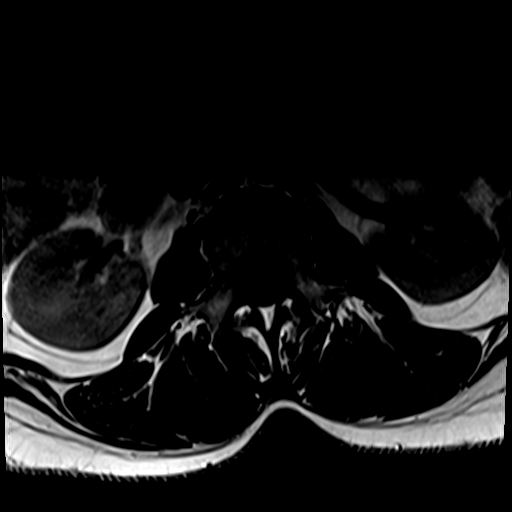
[im 30/35]
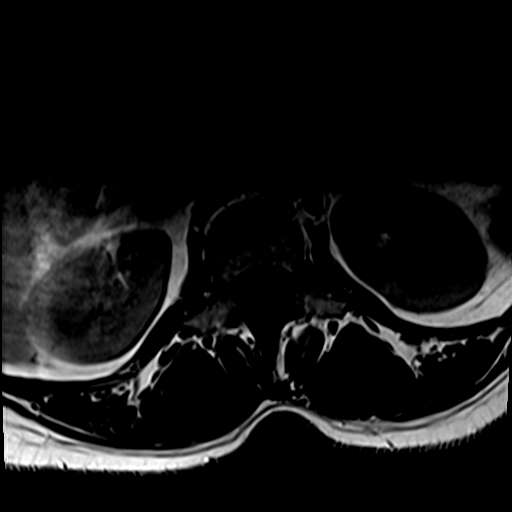
[im 35/35]
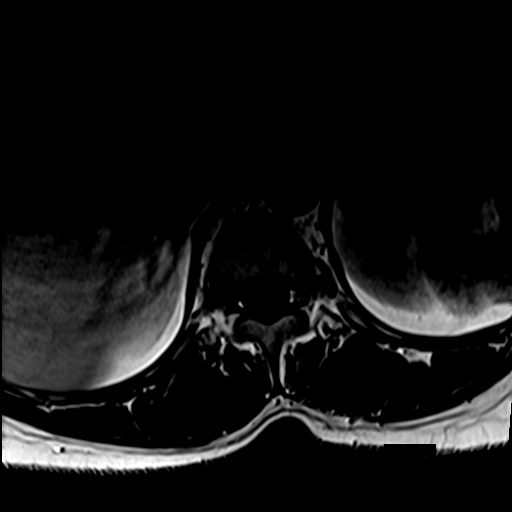

[31 of 48 positions shown; findings below may reference images not displayed]

FINDINGS: Segmentation: Standard. Lowest well-formed disc space labeled the
L5-S1 level.

Alignment: Trace levoscoliosis. Alignment otherwise normal with
preservation of the normal lumbar lordosis. No listhesis.

Vertebrae: Vertebral body height maintained without acute or chronic
fracture. Bone marrow signal intensity within normal limits. No
discrete or worrisome osseous lesions or abnormal marrow edema.

Conus medullaris and cauda equina: Conus extends to the L1 level.
Conus and cauda equina appear normal.

Paraspinal and other soft tissues: Unremarkable.

Disc levels:

L1-2: Normal interspace. Minimal facet spurring. No canal or
foraminal stenosis.

L2-3: Normal interspace. Minimal facet spurring. No canal or
foraminal stenosis.

L3-4: Disc desiccation without significant disc bulge. Mild right
greater than left facet hypertrophy. No spinal stenosis. Mild
bilateral L3 foraminal narrowing. No frank impingement.

L4-5: Disc desiccation with mild disc bulge. Associated central
annular fissure. Mild facet hypertrophy on the right. No spinal
stenosis. Mild bilateral L4 foraminal narrowing. No frank
impingement.

L5-S1: Normal interspace. Moderate right with mild left facet
hypertrophy. No spinal stenosis. Mild right L5 foraminal stenosis.
Left neural foramen remains patent.
IMPRESSION: 1. Mild degenerative disc disease at L3-4 and L4-5 with resultant
mild bilateral L3 and L4 foraminal narrowing. No overt neural
impingement.
2. Mild-to-moderate lower lumbar facet hypertrophy, most pronounced
at L5-S1 on the right.
